# Patient Record
Sex: Female | Born: 1940
Health system: Southern US, Community
[De-identification: ages and names within clinical notes are randomized; demographics above are authoritative.]

---

## 2007-09-28 ENCOUNTER — Ambulatory Visit: Payer: Self-pay | Admitting: Internal Medicine

## 2007-12-06 ENCOUNTER — Ambulatory Visit: Payer: Self-pay | Admitting: Internal Medicine

## 2007-12-14 ENCOUNTER — Ambulatory Visit: Payer: Self-pay | Admitting: Internal Medicine

## 2007-12-19 ENCOUNTER — Ambulatory Visit: Payer: Self-pay | Admitting: Internal Medicine

## 2007-12-20 ENCOUNTER — Ambulatory Visit: Payer: Self-pay | Admitting: Internal Medicine

## 2007-12-22 ENCOUNTER — Ambulatory Visit: Payer: Self-pay | Admitting: Internal Medicine

## 2007-12-23 ENCOUNTER — Ambulatory Visit: Payer: Self-pay | Admitting: Internal Medicine

## 2008-01-22 ENCOUNTER — Ambulatory Visit: Payer: Self-pay | Admitting: Internal Medicine

## 2008-01-24 ENCOUNTER — Ambulatory Visit: Payer: Self-pay | Admitting: Gastroenterology

## 2008-10-03 ENCOUNTER — Ambulatory Visit: Payer: Self-pay | Admitting: Gastroenterology

## 2008-10-21 ENCOUNTER — Ambulatory Visit: Payer: Self-pay | Admitting: Internal Medicine

## 2008-11-20 ENCOUNTER — Ambulatory Visit: Payer: Self-pay | Admitting: Internal Medicine

## 2009-05-21 ENCOUNTER — Ambulatory Visit: Payer: Self-pay | Admitting: Internal Medicine

## 2009-05-31 ENCOUNTER — Ambulatory Visit: Payer: Self-pay | Admitting: Internal Medicine

## 2009-06-04 ENCOUNTER — Ambulatory Visit: Payer: Self-pay | Admitting: Unknown Physician Specialty

## 2009-06-11 ENCOUNTER — Ambulatory Visit: Payer: Self-pay | Admitting: Unknown Physician Specialty

## 2009-06-17 ENCOUNTER — Ambulatory Visit: Payer: Self-pay | Admitting: Internal Medicine

## 2009-06-21 ENCOUNTER — Ambulatory Visit: Payer: Self-pay | Admitting: Internal Medicine

## 2009-07-21 ENCOUNTER — Ambulatory Visit: Payer: Self-pay | Admitting: Internal Medicine

## 2009-08-21 ENCOUNTER — Ambulatory Visit: Payer: Self-pay | Admitting: Internal Medicine

## 2009-09-20 ENCOUNTER — Ambulatory Visit: Payer: Self-pay | Admitting: Internal Medicine

## 2009-10-21 ENCOUNTER — Ambulatory Visit: Payer: Self-pay | Admitting: Internal Medicine

## 2010-02-05 ENCOUNTER — Ambulatory Visit: Payer: Self-pay | Admitting: Internal Medicine

## 2010-02-20 ENCOUNTER — Ambulatory Visit: Payer: Self-pay | Admitting: Internal Medicine

## 2010-07-09 ENCOUNTER — Ambulatory Visit: Payer: Self-pay | Admitting: Internal Medicine

## 2010-07-22 ENCOUNTER — Ambulatory Visit: Payer: Self-pay | Admitting: Internal Medicine

## 2010-10-07 ENCOUNTER — Ambulatory Visit: Payer: Self-pay | Admitting: Internal Medicine

## 2011-01-07 ENCOUNTER — Ambulatory Visit: Payer: Self-pay | Admitting: Internal Medicine

## 2011-01-22 ENCOUNTER — Ambulatory Visit: Payer: Self-pay | Admitting: Internal Medicine

## 2011-05-03 ENCOUNTER — Emergency Department: Payer: Self-pay | Admitting: Emergency Medicine

## 2011-05-03 LAB — URINALYSIS, COMPLETE
Bacteria: NONE SEEN
Bilirubin,UR: NEGATIVE
Glucose,UR: NEGATIVE mg/dL (ref 0–75)
Ketone: NEGATIVE
Nitrite: NEGATIVE
Specific Gravity: 1.003 (ref 1.003–1.030)
Squamous Epithelial: <1
WBC UR: 1 /HPF (ref 0–5)

## 2011-05-03 LAB — DRUG SCREEN, URINE
Barbiturates, Ur Screen: NEGATIVE (ref ?–200)
Cannabinoid 50 Ng, Ur ~~LOC~~: NEGATIVE (ref ?–50)
Cocaine Metabolite,Ur ~~LOC~~: NEGATIVE (ref ?–300)
MDMA (Ecstasy)Ur Screen: NEGATIVE (ref ?–500)
Methadone, Ur Screen: NEGATIVE (ref ?–300)
Opiate, Ur Screen: NEGATIVE (ref ?–300)

## 2011-05-04 LAB — COMPREHENSIVE METABOLIC PANEL
Albumin: 3.4 g/dL (ref 3.4–5.0)
Alkaline Phosphatase: 129 U/L (ref 50–136)
BUN: 10 mg/dL (ref 7–18)
Bilirubin,Total: 0.4 mg/dL (ref 0.2–1.0)
Calcium, Total: 8.9 mg/dL (ref 8.5–10.1)
Chloride: 104 mmol/L (ref 98–107)
Creatinine: 0.95 mg/dL (ref 0.60–1.30)
EGFR (African American): >60
Glucose: 78 mg/dL (ref 65–99)
Potassium: 3.3 mmol/L — ABNORMAL LOW (ref 3.5–5.1)
SGPT (ALT): 47 U/L
Total Protein: 7.1 g/dL (ref 6.4–8.2)

## 2011-05-04 LAB — ETHANOL: Ethanol %: 0.293 % — ABNORMAL HIGH (ref 0.000–0.080)

## 2011-05-04 LAB — CBC
HCT: 36.9 % (ref 35.0–47.0)
HGB: 12.7 g/dL (ref 12.0–16.0)
MCH: 37.7 pg — ABNORMAL HIGH (ref 26.0–34.0)
MCHC: 34.4 g/dL (ref 32.0–36.0)
MCV: 109 fL — ABNORMAL HIGH (ref 80–100)
RDW: 11.8 % (ref 11.5–14.5)
WBC: 6.7 10*3/uL (ref 3.6–11.0)

## 2011-07-11 ENCOUNTER — Emergency Department: Payer: Self-pay | Admitting: Emergency Medicine

## 2011-08-17 ENCOUNTER — Emergency Department: Payer: Self-pay | Admitting: Emergency Medicine

## 2011-09-04 ENCOUNTER — Emergency Department: Payer: Self-pay | Admitting: Emergency Medicine

## 2011-09-04 LAB — COMPREHENSIVE METABOLIC PANEL
Albumin: 2.9 g/dL — ABNORMAL LOW (ref 3.4–5.0)
BUN: 12 mg/dL (ref 7–18)
Bilirubin,Total: 0.5 mg/dL (ref 0.2–1.0)
Calcium, Total: 8.4 mg/dL — ABNORMAL LOW (ref 8.5–10.1)
Chloride: 105 mmol/L (ref 98–107)
Co2: 21 mmol/L (ref 21–32)
EGFR (Non-African Amer.): >60
SGOT(AST): 132 U/L — ABNORMAL HIGH (ref 15–37)
SGPT (ALT): 67 U/L

## 2011-09-04 LAB — URINALYSIS, COMPLETE
Bilirubin,UR: NEGATIVE
Blood: NEGATIVE
Ph: 6 (ref 4.5–8.0)
Protein: NEGATIVE
Specific Gravity: 1.003 (ref 1.003–1.030)
WBC UR: 16 /HPF (ref 0–5)

## 2011-09-04 LAB — CBC
HGB: 10.7 g/dL — ABNORMAL LOW (ref 12.0–16.0)
MCH: 38.4 pg — ABNORMAL HIGH (ref 26.0–34.0)
MCHC: 33.8 g/dL (ref 32.0–36.0)
MCV: 114 fL — ABNORMAL HIGH (ref 80–100)
Platelet: 154 10*3/uL (ref 150–440)
RBC: 2.78 10*6/uL — ABNORMAL LOW (ref 3.80–5.20)
RDW: 12.6 % (ref 11.5–14.5)
WBC: 6.2 10*3/uL (ref 3.6–11.0)

## 2011-09-04 LAB — ETHANOL
Ethanol %: 0.275 % — ABNORMAL HIGH (ref 0.000–0.080)
Ethanol: 275 mg/dL

## 2011-09-08 ENCOUNTER — Emergency Department: Payer: Self-pay | Admitting: Emergency Medicine

## 2011-09-08 LAB — ETHANOL: Ethanol %: 0.297 % — ABNORMAL HIGH (ref 0.000–0.080)

## 2011-09-18 ENCOUNTER — Ambulatory Visit: Payer: Self-pay | Admitting: Internal Medicine

## 2011-09-22 ENCOUNTER — Ambulatory Visit: Payer: Self-pay | Admitting: Internal Medicine

## 2011-09-23 ENCOUNTER — Ambulatory Visit: Payer: Self-pay | Admitting: Internal Medicine

## 2011-09-23 LAB — CBC CANCER CENTER
Basophil %: 0.8 %
HCT: 35.4 % (ref 35.0–47.0)
HGB: 11.7 g/dL — ABNORMAL LOW (ref 12.0–16.0)
Lymphocyte %: 11.8 %
MCV: 113 fL — ABNORMAL HIGH (ref 80–100)
Monocyte #: 0.5 x10 3/mm (ref 0.2–0.9)
Monocyte %: 10.4 %
Neutrophil #: 3.9 x10 3/mm (ref 1.4–6.5)
RDW: 12.7 % (ref 11.5–14.5)
WBC: 5.2 x10 3/mm (ref 3.6–11.0)

## 2011-09-23 LAB — COMPREHENSIVE METABOLIC PANEL
Albumin: 3.5 g/dL (ref 3.4–5.0)
Anion Gap: 5 — ABNORMAL LOW (ref 7–16)
Calcium, Total: 8.8 mg/dL (ref 8.5–10.1)
Co2: 31 mmol/L (ref 21–32)
EGFR (African American): 58 — ABNORMAL LOW
Glucose: 106 mg/dL — ABNORMAL HIGH (ref 65–99)
Osmolality: 274 (ref 275–301)
SGOT(AST): 164 U/L — ABNORMAL HIGH (ref 15–37)
Sodium: 137 mmol/L (ref 136–145)

## 2011-09-23 LAB — APTT: Activated PTT: 29.4 secs (ref 23.6–35.9)

## 2011-09-23 LAB — PROTIME-INR: INR: 0.9

## 2011-10-05 ENCOUNTER — Ambulatory Visit: Payer: Self-pay | Admitting: Internal Medicine

## 2011-10-13 ENCOUNTER — Other Ambulatory Visit (HOSPITAL_COMMUNITY): Payer: Self-pay | Admitting: Cardiothoracic Surgery

## 2011-10-13 DIAGNOSIS — R911 Solitary pulmonary nodule: Secondary | ICD-10-CM

## 2011-10-15 ENCOUNTER — Other Ambulatory Visit: Payer: Self-pay | Admitting: Radiology

## 2011-10-20 ENCOUNTER — Ambulatory Visit (HOSPITAL_COMMUNITY)
Admission: RE | Admit: 2011-10-20 | Discharge: 2011-10-20 | Disposition: A | Discharge status: Home / Self Care | Payer: Medicare Other | Source: Ambulatory Visit | Attending: Cardiothoracic Surgery | Admitting: Cardiothoracic Surgery

## 2011-10-20 ENCOUNTER — Ambulatory Visit (HOSPITAL_COMMUNITY)
Admission: RE | Admit: 2011-10-20 | Discharge: 2011-10-20 | Disposition: A | Discharge status: Home / Self Care | Payer: Medicare Other | Source: Ambulatory Visit | Attending: Interventional Radiology | Admitting: Interventional Radiology

## 2011-10-20 VITALS — BP 130/56 | HR 86 | Temp 98.8°F | Resp 16

## 2011-10-20 DIAGNOSIS — R911 Solitary pulmonary nodule: Secondary | ICD-10-CM

## 2011-10-20 DIAGNOSIS — IMO0002 Reserved for concepts with insufficient information to code with codable children: Secondary | ICD-10-CM | POA: Insufficient documentation

## 2011-10-20 DIAGNOSIS — Z85819 Personal history of malignant neoplasm of unspecified site of lip, oral cavity, and pharynx: Secondary | ICD-10-CM | POA: Insufficient documentation

## 2011-10-20 DIAGNOSIS — Z79899 Other long term (current) drug therapy: Secondary | ICD-10-CM | POA: Insufficient documentation

## 2011-10-20 DIAGNOSIS — I10 Essential (primary) hypertension: Secondary | ICD-10-CM | POA: Insufficient documentation

## 2011-10-20 DIAGNOSIS — C3410 Malignant neoplasm of upper lobe, unspecified bronchus or lung: Secondary | ICD-10-CM | POA: Insufficient documentation

## 2011-10-20 DIAGNOSIS — H409 Unspecified glaucoma: Secondary | ICD-10-CM | POA: Insufficient documentation

## 2011-10-20 DIAGNOSIS — Y849 Medical procedure, unspecified as the cause of abnormal reaction of the patient, or of later complication, without mention of misadventure at the time of the procedure: Secondary | ICD-10-CM | POA: Insufficient documentation

## 2011-10-20 LAB — CBC
Hemoglobin: 11.2 g/dL — ABNORMAL LOW (ref 12.0–15.0)
MCH: 37.7 pg — ABNORMAL HIGH (ref 26.0–34.0)
MCHC: 34.7 g/dL (ref 30.0–36.0)
Platelets: 227 10*3/uL (ref 150–400)
RBC: 2.97 MIL/uL — ABNORMAL LOW (ref 3.87–5.11)

## 2011-10-20 LAB — PROTIME-INR
INR: 0.96 (ref 0.00–1.49)
Prothrombin Time: 13 seconds (ref 11.6–15.2)

## 2011-10-20 LAB — APTT: aPTT: 31 seconds (ref 24–37)

## 2011-10-20 MED ORDER — HYDROCODONE-ACETAMINOPHEN 5-325 MG PO TABS
1.0000 | ORAL_TABLET | ORAL | Status: DC | PRN
  Filled 2011-10-20: qty 2

## 2011-10-20 MED ORDER — FENTANYL CITRATE 0.05 MG/ML IJ SOLN
INTRAMUSCULAR | Status: AC
  Filled 2011-10-20: qty 4

## 2011-10-20 MED ORDER — SODIUM CHLORIDE 0.9 % IV SOLN: Freq: Once | INTRAVENOUS | Status: DC

## 2011-10-20 MED ORDER — MIDAZOLAM HCL 5 MG/5ML IJ SOLN
INTRAMUSCULAR | Status: AC | PRN
  Administered 2011-10-20 (×2): 1 mg via INTRAVENOUS

## 2011-10-20 MED ORDER — MIDAZOLAM HCL 2 MG/2ML IJ SOLN
INTRAMUSCULAR | Status: AC
  Filled 2011-10-20: qty 4

## 2011-10-20 MED ORDER — FENTANYL CITRATE 0.05 MG/ML IJ SOLN
INTRAMUSCULAR | Status: AC | PRN
  Administered 2011-10-20 (×2): 50 ug via INTRAVENOUS

## 2011-10-20 NOTE — H&P (Signed)
Agree 

## 2011-10-20 NOTE — H&P (Signed)
Ashley Frank is an 71 y.o. female.   Chief Complaint: "I'm here for a lung biopsy" HPI: Patient with history of squamous cell carcinoma of head/neck and hypermetabolic lung masses on recent PET scan presents today for CT guided biopsy of a right upper lobe lung mass.  PMH:HTN, glaucoma, throat cancer [id number], appendectomy, hysterectomy Social History: prior smoker, widowed, lives in [place] VH:QIONGEXB for bladder cancer, DM; 1 sister Allergies:  Allergies  Allergen Reactions  . Zantac (Ranitidine Hcl) Rash    Current outpatient prescriptions:buPROPion (WELLBUTRIN XL) 150 MG 24 hr tablet, Take 150 mg by mouth daily., Disp: , Rfl: ;  chlordiazePOXIDE (LIBRIUM) 10 MG capsule, Take 10 mg by mouth at bedtime., Disp: , Rfl: ;  lactose free nutrition (BOOST) LIQD, Take 1 Container by mouth daily., Disp: , Rfl: ;  losartan-hydrochlorothiazide (HYZAAR) 100-12.5 MG per tablet, Take 1 tablet by mouth daily., Disp: , Rfl:  potassium chloride 20 MEQ/15ML (10%) solution, Take 20 mEq by mouth daily., Disp: , Rfl: ;  Risedronate Sodium (ATELVIA) 35 MG TBEC, Take 1 tablet by mouth once a week. On saturday, Disp: , Rfl: ;  senna-docusate (PERI-COLACE) 8.6-50 MG per tablet, Take 2 tablets by mouth daily as needed. For constipation, Disp: , Rfl: ;  timolol (BETIMOL) 0.5 % ophthalmic solution, Place 1 drop into both eyes 2 (two) times daily., Disp: , Rfl:  Current facility-administered medications:0.9 %  sodium chloride infusion, , Intravenous, Once, Brayton El, PA   Results for orders placed during the hospital encounter of 10/20/11 (from the past 48 hour(s))  CBC     Status: Abnormal   Collection Time   10/20/11 11:40 AM      Component Value Range Comment   WBC 7.7  4.0 - 10.5 K/uL    RBC 2.97 (*) 3.87 - 5.11 MIL/uL    Hemoglobin 11.2 (*) 12.0 - 15.0 g/dL    HCT 28.4 (*) 13.2 - 46.0 %    MCV 108.8 (*) 78.0 - 100.0 fL    MCH 37.7 (*) 26.0 - 34.0 pg    MCHC 34.7  30.0 - 36.0 g/dL    RDW 44.0   10.2 - 72.5 %    Platelets 227  150 - 400 K/uL    Results for orders placed during the hospital encounter of 10/20/11  APTT      Component Value Range   aPTT 31  24 - 37 seconds  CBC      Component Value Range   WBC 7.7  4.0 - 10.5 K/uL   RBC 2.97 (*) 3.87 - 5.11 MIL/uL   Hemoglobin 11.2 (*) 12.0 - 15.0 g/dL   HCT 36.6 (*) 44.0 - 34.7 %   MCV 108.8 (*) 78.0 - 100.0 fL   MCH 37.7 (*) 26.0 - 34.0 pg   MCHC 34.7  30.0 - 36.0 g/dL   RDW 42.5  95.6 - 38.7 %   Platelets 227  150 - 400 K/uL  PROTIME-INR      Component Value Range   Prothrombin Time 13.0  11.6 - 15.2 seconds   INR 0.96  0.00 - 1.49    Review of Systems  Constitutional: Negative for fever and chills.  HENT:       Occ HA's  Respiratory: Negative for cough, hemoptysis and shortness of breath.   Cardiovascular: Negative for chest pain.  Gastrointestinal: Negative for nausea, vomiting and abdominal pain.  Musculoskeletal: Negative for back pain.  Endo/Heme/Allergies: Bruises/bleeds easily.    Blood pressure 137/59, pulse 89,  temperature 99.5 F (37.5 C), SpO2 99.00%. Physical Exam  Constitutional: She is oriented to person, place, and time.       Thin WF in NAD  Cardiovascular: Normal rate and regular rhythm.   Respiratory: Effort normal and breath sounds normal.  GI: Soft. Bowel sounds are normal. There is no tenderness.  Musculoskeletal: Normal range of motion. She exhibits no edema.  Neurological: She is alert and oriented to person, place, and time.     Assessment/Plan Patient with history of squamous cell carcinoma of head/neck and hypermetabolic lung masses; plan is for CT guided biopsy of a right upper lobe lung mass today. Details/risks of procedure d/w pt with her understanding and consent.  Jerusalem Wert,D KEVIN 10/20/2011, 12:16 PM

## 2011-10-20 NOTE — Procedures (Signed)
Procedure:  CT guided core biopsy of right lung nodule Findings:  Right apical lung nodule biopsy from posterior approach via 17 G needle.  18 G core biopsy x 2 Regional hemorrhage after biopsy without pneumothorax.

## 2011-10-22 ENCOUNTER — Ambulatory Visit: Payer: Self-pay | Admitting: Internal Medicine

## 2011-11-17 LAB — BASIC METABOLIC PANEL
BUN: 9 mg/dL (ref 7–18)
Calcium, Total: 8.8 mg/dL (ref 8.5–10.1)
Chloride: 106 mmol/L (ref 98–107)
EGFR (African American): 55 — ABNORMAL LOW
Osmolality: 284 (ref 275–301)
Potassium: 3.7 mmol/L (ref 3.5–5.1)
Sodium: 143 mmol/L (ref 136–145)

## 2011-11-17 LAB — CBC CANCER CENTER
Eosinophil #: 0 x10 3/mm (ref 0.0–0.7)
HCT: 34.8 % — ABNORMAL LOW (ref 35.0–47.0)
Lymphocyte %: 11.6 %
MCH: 37.7 pg — ABNORMAL HIGH (ref 26.0–34.0)
MCHC: 33.5 g/dL (ref 32.0–36.0)
Monocyte #: 0.4 x10 3/mm (ref 0.2–0.9)
Neutrophil %: 79.8 %
Platelet: 204 x10 3/mm (ref 150–440)
RBC: 3.09 10*6/uL — ABNORMAL LOW (ref 3.80–5.20)

## 2011-11-17 LAB — HEPATIC FUNCTION PANEL A (ARMC)
Alkaline Phosphatase: 172 U/L — ABNORMAL HIGH (ref 50–136)
SGPT (ALT): 38 U/L (ref 12–78)

## 2011-11-22 ENCOUNTER — Ambulatory Visit: Payer: Self-pay | Admitting: Internal Medicine

## 2011-11-24 LAB — CBC CANCER CENTER
Basophil %: 0.5 %
Eosinophil %: 1 %
HGB: 9.8 g/dL — ABNORMAL LOW (ref 12.0–16.0)
Lymphocyte %: 33.3 %
MCH: 37.4 pg — ABNORMAL HIGH (ref 26.0–34.0)
MCV: 111 fL — ABNORMAL HIGH (ref 80–100)
Monocyte %: 3.7 %
Neutrophil %: 61.5 %
Platelet: 82 x10 3/mm — ABNORMAL LOW (ref 150–440)
RBC: 2.61 10*6/uL — ABNORMAL LOW (ref 3.80–5.20)

## 2011-12-01 LAB — HEPATIC FUNCTION PANEL A (ARMC)
Albumin: 2.8 g/dL — ABNORMAL LOW (ref 3.4–5.0)
Alkaline Phosphatase: 132 U/L (ref 50–136)
Bilirubin,Total: 0.7 mg/dL (ref 0.2–1.0)
SGPT (ALT): 48 U/L (ref 12–78)
Total Protein: 6 g/dL — ABNORMAL LOW (ref 6.4–8.2)

## 2011-12-01 LAB — CBC CANCER CENTER
HCT: 25 % — ABNORMAL LOW (ref 35.0–47.0)
HGB: 8.8 g/dL — ABNORMAL LOW (ref 12.0–16.0)
Lymphocytes: 21 %
MCHC: 35.2 g/dL (ref 32.0–36.0)
RBC: 2.29 10*6/uL — ABNORMAL LOW (ref 3.80–5.20)
RDW: 11.5 % (ref 11.5–14.5)
Segmented Neutrophils: 53 %
WBC: 1.3 x10 3/mm — CL (ref 3.6–11.0)

## 2011-12-01 LAB — BASIC METABOLIC PANEL
Anion Gap: 10 (ref 7–16)
BUN: 18 mg/dL (ref 7–18)
Co2: 22 mmol/L (ref 21–32)
Creatinine: 1.19 mg/dL (ref 0.60–1.30)
EGFR (African American): 54 — ABNORMAL LOW
EGFR (Non-African Amer.): 46 — ABNORMAL LOW
Osmolality: 272 (ref 275–301)

## 2011-12-08 LAB — CBC CANCER CENTER
Basophil #: 0 x10 3/mm (ref 0.0–0.1)
Basophil %: 0.1 %
Eosinophil #: 0 x10 3/mm (ref 0.0–0.7)
Eosinophil %: 0.6 %
HGB: 9.2 g/dL — ABNORMAL LOW (ref 12.0–16.0)
Lymphocyte %: 9.1 %
MCH: 39.3 pg — ABNORMAL HIGH (ref 26.0–34.0)
MCHC: 35.4 g/dL (ref 32.0–36.0)
MCV: 111 fL — ABNORMAL HIGH (ref 80–100)
Monocyte #: 0.5 x10 3/mm (ref 0.2–0.9)
Monocyte %: 15.2 %
Neutrophil #: 2.7 x10 3/mm (ref 1.4–6.5)
Neutrophil %: 75 %
RBC: 2.34 10*6/uL — ABNORMAL LOW (ref 3.80–5.20)

## 2011-12-15 LAB — CBC CANCER CENTER
Eosinophil #: 0 x10 3/mm (ref 0.0–0.7)
HCT: 27.4 % — ABNORMAL LOW (ref 35.0–47.0)
Lymphocyte #: 0.3 x10 3/mm — ABNORMAL LOW (ref 1.0–3.6)
Lymphocyte %: 6.6 %
MCHC: 33.8 g/dL (ref 32.0–36.0)
MCV: 115 fL — ABNORMAL HIGH (ref 80–100)
Monocyte %: 11.2 %
Neutrophil %: 81.3 %
Platelet: 178 x10 3/mm (ref 150–440)
RDW: 14.1 % (ref 11.5–14.5)
WBC: 4.3 x10 3/mm (ref 3.6–11.0)

## 2011-12-22 ENCOUNTER — Ambulatory Visit: Payer: Self-pay | Admitting: Internal Medicine

## 2011-12-22 LAB — CBC CANCER CENTER
Basophil #: 0 x10 3/mm (ref 0.0–0.1)
Eosinophil #: 0 x10 3/mm (ref 0.0–0.7)
HGB: 7.4 g/dL — ABNORMAL LOW (ref 12.0–16.0)
Lymphocyte %: 9.2 %
MCH: 37.9 pg — ABNORMAL HIGH (ref 26.0–34.0)
MCHC: 33.3 g/dL (ref 32.0–36.0)
MCV: 114 fL — ABNORMAL HIGH (ref 80–100)
Monocyte #: 0.1 x10 3/mm — ABNORMAL LOW (ref 0.2–0.9)
Neutrophil %: 80.8 %
Platelet: 63 x10 3/mm — ABNORMAL LOW (ref 150–440)
RDW: 14.2 % (ref 11.5–14.5)
WBC: 0.8 x10 3/mm — CL (ref 3.6–11.0)

## 2011-12-29 LAB — HEPATIC FUNCTION PANEL A (ARMC)
Albumin: 2.8 g/dL — ABNORMAL LOW (ref 3.4–5.0)
Alkaline Phosphatase: 124 U/L (ref 50–136)
Bilirubin, Direct: 0.2 mg/dL (ref 0.00–0.20)
Bilirubin,Total: 0.5 mg/dL (ref 0.2–1.0)
SGPT (ALT): 29 U/L (ref 12–78)
Total Protein: 6.2 g/dL — ABNORMAL LOW (ref 6.4–8.2)

## 2011-12-29 LAB — CBC CANCER CENTER
Basophil #: 0 x10 3/mm (ref 0.0–0.1)
Eosinophil #: 0 x10 3/mm (ref 0.0–0.7)
Lymphocyte #: 0.3 x10 3/mm — ABNORMAL LOW (ref 1.0–3.6)
Lymphocyte %: 9.1 %
MCHC: 33.3 g/dL (ref 32.0–36.0)
Monocyte #: 1 x10 3/mm — ABNORMAL HIGH (ref 0.2–0.9)
Neutrophil #: 1.6 x10 3/mm (ref 1.4–6.5)
Neutrophil %: 55 %
Platelet: 54 x10 3/mm — ABNORMAL LOW (ref 150–440)
RBC: 1.69 10*6/uL — ABNORMAL LOW (ref 3.80–5.20)
RDW: 14.8 % — ABNORMAL HIGH (ref 11.5–14.5)

## 2011-12-29 LAB — BASIC METABOLIC PANEL
Anion Gap: 12 (ref 7–16)
BUN: 22 mg/dL — ABNORMAL HIGH (ref 7–18)
Calcium, Total: 8.8 mg/dL (ref 8.5–10.1)
EGFR (African American): 36 — ABNORMAL LOW
EGFR (Non-African Amer.): 31 — ABNORMAL LOW
Glucose: 108 mg/dL — ABNORMAL HIGH (ref 65–99)
Osmolality: 280 (ref 275–301)
Potassium: 3 mmol/L — ABNORMAL LOW (ref 3.5–5.1)
Sodium: 138 mmol/L (ref 136–145)

## 2011-12-30 ENCOUNTER — Ambulatory Visit: Payer: Self-pay | Admitting: Internal Medicine

## 2012-01-05 LAB — CBC CANCER CENTER
Basophil %: 0.1 %
Eosinophil %: 2.5 %
HCT: 24.5 % — ABNORMAL LOW (ref 35.0–47.0)
HGB: 8.2 g/dL — ABNORMAL LOW (ref 12.0–16.0)
Lymphocyte #: 0.5 x10 3/mm — ABNORMAL LOW (ref 1.0–3.6)
Lymphocyte %: 10.1 %
MCV: 111 fL — ABNORMAL HIGH (ref 80–100)
Monocyte %: 19.8 %
Neutrophil #: 3.2 x10 3/mm (ref 1.4–6.5)
Platelet: 202 x10 3/mm (ref 150–440)
RBC: 2.21 10*6/uL — ABNORMAL LOW (ref 3.80–5.20)
WBC: 4.7 x10 3/mm (ref 3.6–11.0)

## 2012-01-15 ENCOUNTER — Encounter: Payer: Self-pay | Admitting: Internal Medicine

## 2012-01-22 ENCOUNTER — Ambulatory Visit: Payer: Self-pay | Admitting: Internal Medicine

## 2012-01-26 LAB — CBC CANCER CENTER
Basophil #: 0 x10 3/mm (ref 0.0–0.1)
HCT: 23.3 % — ABNORMAL LOW (ref 35.0–47.0)
Lymphocyte %: 10 %
MCH: 37.6 pg — ABNORMAL HIGH (ref 26.0–34.0)
MCHC: 32.7 g/dL (ref 32.0–36.0)
Monocyte %: 17.4 %
Neutrophil #: 4.7 x10 3/mm (ref 1.4–6.5)
Neutrophil %: 69.4 %
RBC: 2.02 10*6/uL — ABNORMAL LOW (ref 3.80–5.20)
RDW: 15.2 % — ABNORMAL HIGH (ref 11.5–14.5)
WBC: 6.7 x10 3/mm (ref 3.6–11.0)

## 2012-02-02 LAB — CBC CANCER CENTER
Basophil #: 0 x10 3/mm (ref 0.0–0.1)
Eosinophil #: 0.3 x10 3/mm (ref 0.0–0.7)
HGB: 7.7 g/dL — ABNORMAL LOW (ref 12.0–16.0)
Lymphocyte %: 7.1 %
MCHC: 32.9 g/dL (ref 32.0–36.0)
Monocyte %: 14.8 %
Neutrophil #: 5.1 x10 3/mm (ref 1.4–6.5)
Neutrophil %: 72.8 %
Platelet: 245 x10 3/mm (ref 150–440)
RBC: 2.03 10*6/uL — ABNORMAL LOW (ref 3.80–5.20)
RDW: 13.7 % (ref 11.5–14.5)

## 2012-02-16 LAB — CBC CANCER CENTER
Basophil #: 0 x10 3/mm (ref 0.0–0.1)
Eosinophil #: 0.3 x10 3/mm (ref 0.0–0.7)
Lymphocyte #: 0.6 x10 3/mm — ABNORMAL LOW (ref 1.0–3.6)
Lymphocyte %: 10.8 %
MCHC: 35 g/dL (ref 32.0–36.0)
MCV: 111 fL — ABNORMAL HIGH (ref 80–100)
Monocyte %: 14.6 %
Neutrophil #: 4.1 x10 3/mm (ref 1.4–6.5)
Neutrophil %: 69.3 %
Platelet: 243 x10 3/mm (ref 150–440)
RDW: 12.5 % (ref 11.5–14.5)
WBC: 6 x10 3/mm (ref 3.6–11.0)

## 2012-02-16 LAB — HEPATIC FUNCTION PANEL A (ARMC)
Albumin: 2.8 g/dL — ABNORMAL LOW (ref 3.4–5.0)
Alkaline Phosphatase: 138 U/L — ABNORMAL HIGH (ref 50–136)
Bilirubin, Direct: 0.1 mg/dL (ref 0.00–0.20)
SGOT(AST): 23 U/L (ref 15–37)
SGPT (ALT): 11 U/L — ABNORMAL LOW (ref 12–78)
Total Protein: 6.3 g/dL — ABNORMAL LOW (ref 6.4–8.2)

## 2012-02-21 ENCOUNTER — Ambulatory Visit: Payer: Self-pay | Admitting: Internal Medicine

## 2012-03-08 ENCOUNTER — Encounter: Payer: Self-pay | Admitting: Cardiothoracic Surgery

## 2012-03-08 ENCOUNTER — Encounter: Payer: Self-pay | Admitting: Nurse Practitioner

## 2012-03-23 ENCOUNTER — Ambulatory Visit: Payer: Self-pay | Admitting: Internal Medicine

## 2012-03-23 ENCOUNTER — Encounter: Payer: Self-pay | Admitting: Nurse Practitioner

## 2012-03-23 ENCOUNTER — Encounter: Payer: Self-pay | Admitting: Cardiothoracic Surgery

## 2012-04-23 DEATH — deceased

## 2013-05-16 IMAGING — CT CT CERVICAL SPINE WITHOUT CONTRAST
1 series · 12 of 14 positions shown, 15 images · non-contrast
Comparison: None

REASON FOR EXAM: fall with occipital trauma
COMMENTS:

PROCEDURE:     CT  - CT CERVICAL SPINE WO  - September 08, 2011 [DATE]
RESULT:     Clinical Indication: Trauma
TECHNIQUE: Multiple axial CT images from the skull base to the mid vertebral
body of T1. obtained with sagittal and coronal reformatted images provided.

[Series 4: axial · axial · 0.33mm/px · z∈[+156,+271]mm · 12 of 83 slices shown, 15 images]
[im 7/83  soft-tissue]
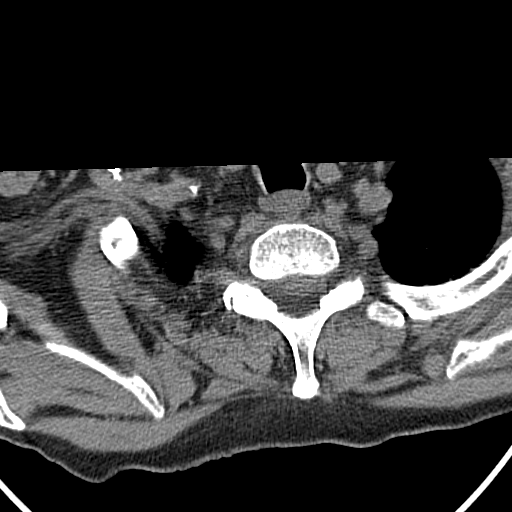
[im 7/83  bone]
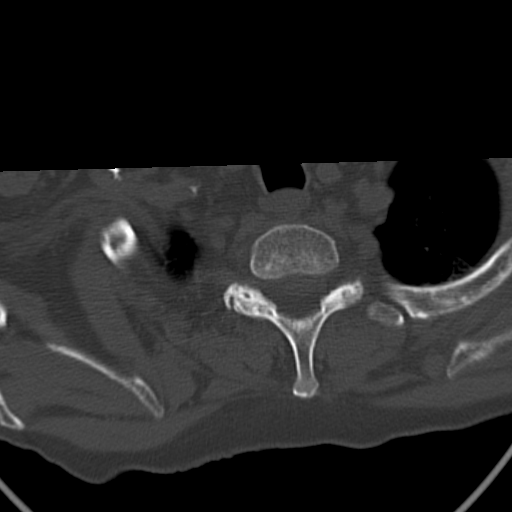
[im 13/83  bone]
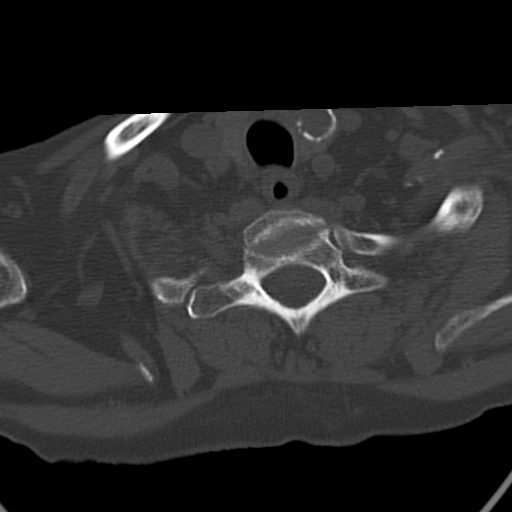
[im 19/83  bone]
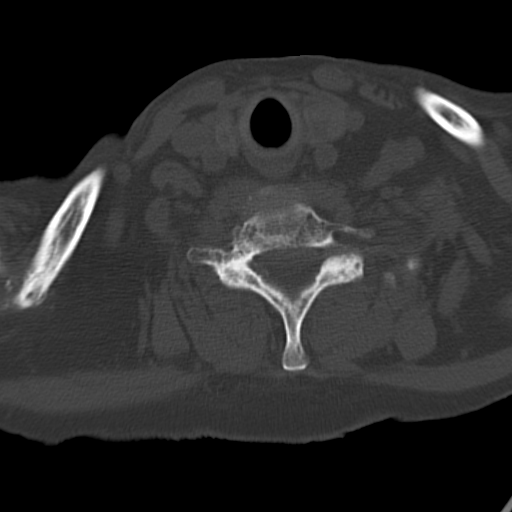
[im 26/83  bone]
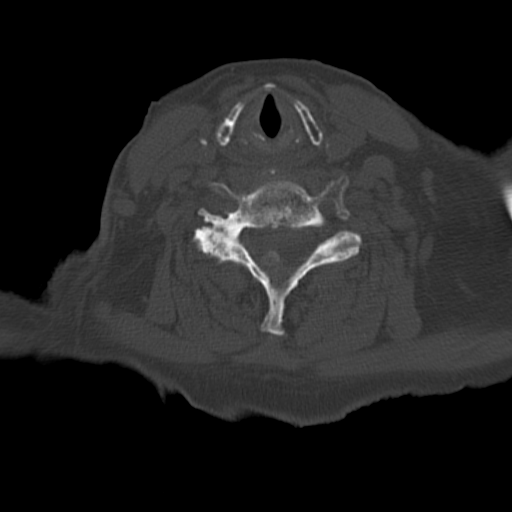
[im 32/83  soft-tissue]
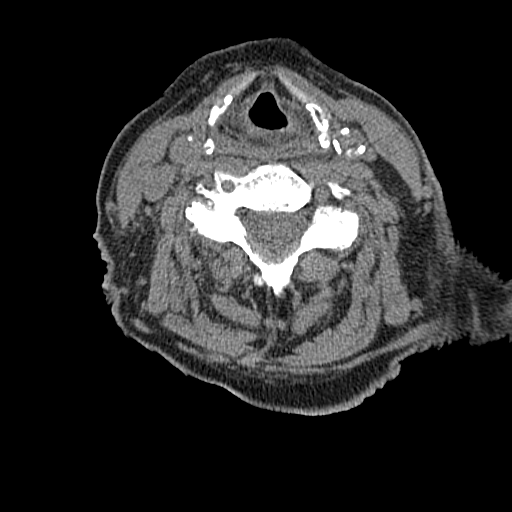
[im 32/83  bone]
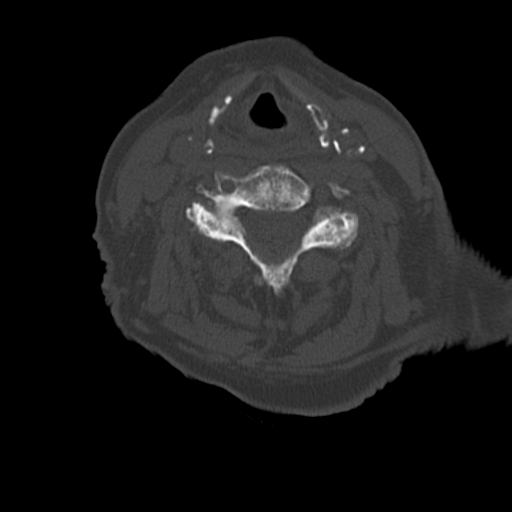
[im 38/83  bone]
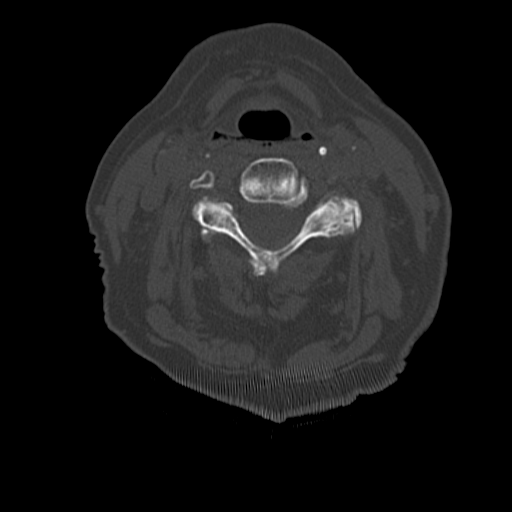
[im 45/83  bone]
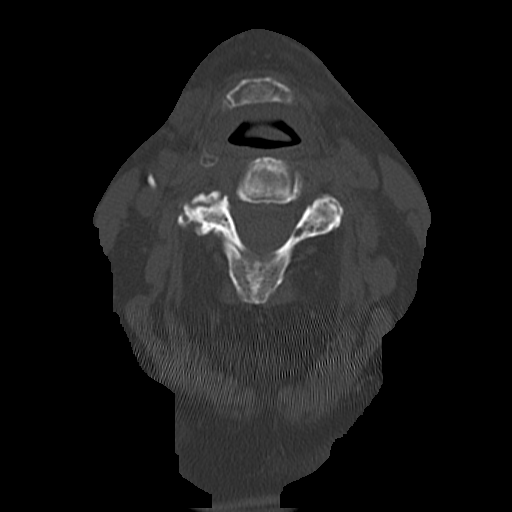
[im 51/83  bone]
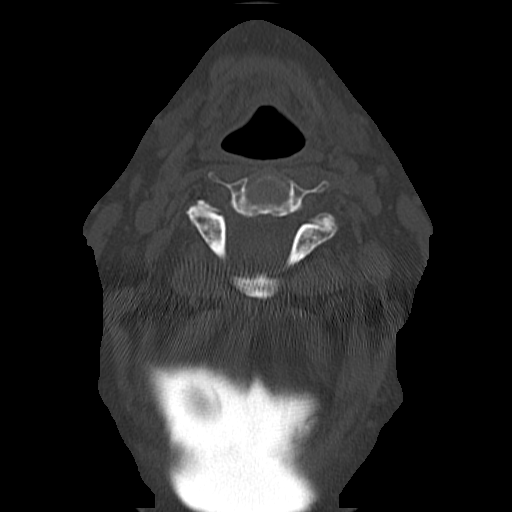
[im 57/83  soft-tissue]
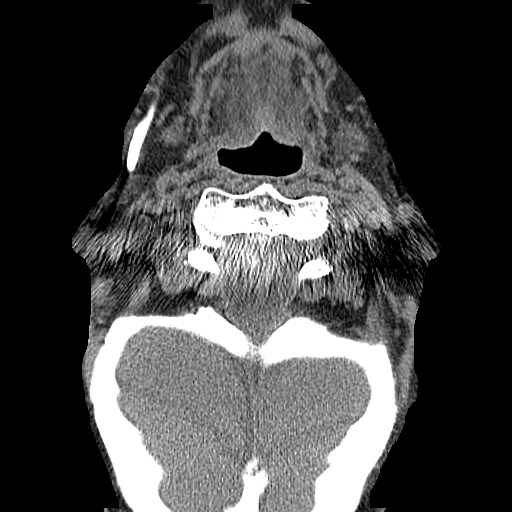
[im 57/83  bone]
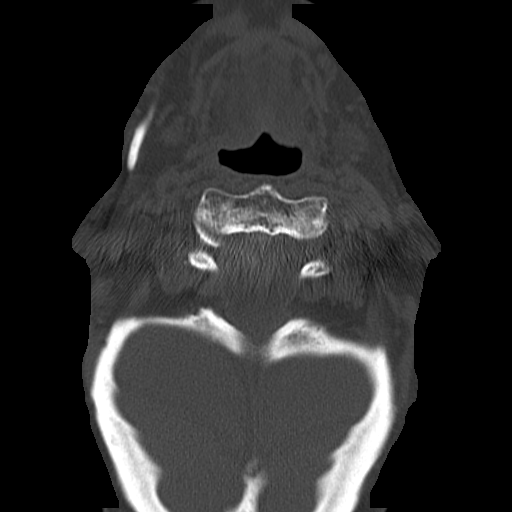
[im 64/83  bone]
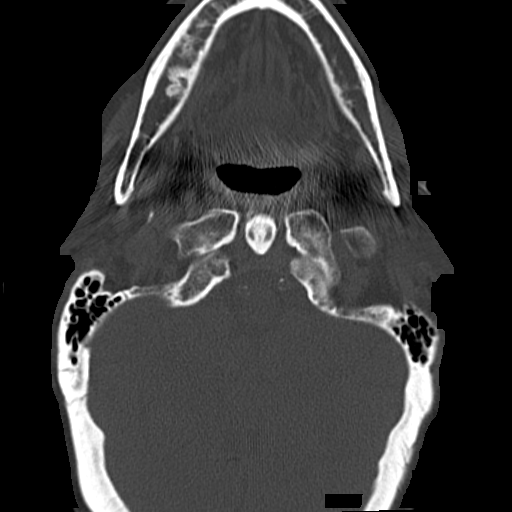
[im 70/83  bone]
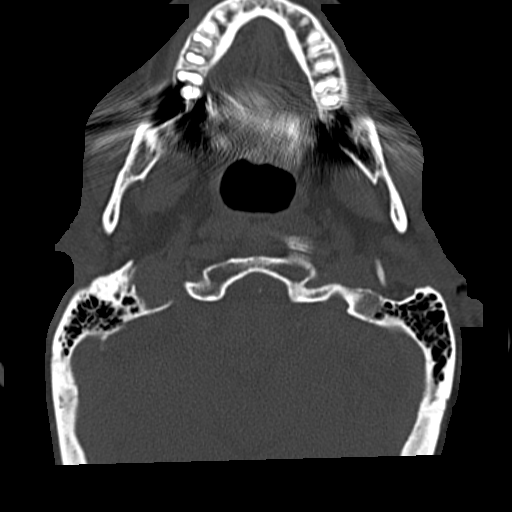
[im 76/83  bone]
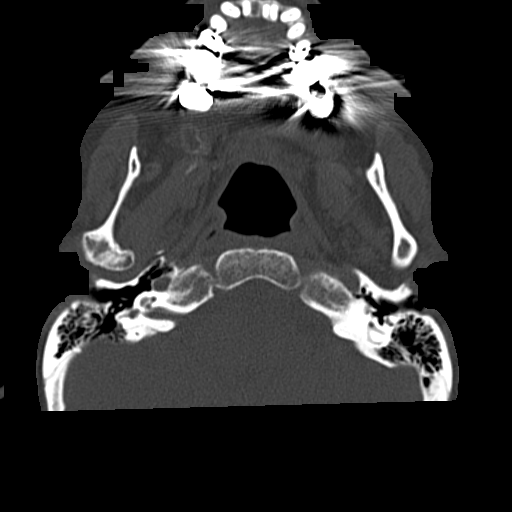

[12 of 14 positions shown; findings below may reference images not displayed]

FINDINGS: The alignment is anatomic. The vertebral body heights are maintained. There
is no acute fracture or static listhesis. The prevertebral soft tissues are
normal. The intraspinal soft tissues are not fully imaged on this
examination due to poor soft tissue contrast, but there is no soft tissue
gross abnormality.

There is facet arthropathy throughout the cervical spine. The disc spaces
are maintained.

The visualized portions of the lung apices demonstrate no focal abnormality.
IMPRESSION: 1. No acute osseous injury of the cervical spine.

2. Ligamentous injury is not evaluated. If there is high clinical concern
for ligamentous injury, consider MRI or flexion/extension radiographs as
clinically indicated and tolerated.

[REDACTED]

## 2013-06-27 IMAGING — CT CT BIOPSY
1 of 12 series · 3 of 32 positions shown, 7 images · non-contrast
Comparison: PET scan obtained at [HOSPITAL]
on 10/05/2011.

CLINICAL DATA: History of supraglottic squamous cell carcinoma.
The patient has developed bilateral upper lobe pulmonary nodules,
right greater than left, with PET scan demonstrating increased
metabolic activity.

CT GUIDED CORE BIOPSY OF RIGHT UPPER LOBE LUNG NODULE
Sedation:   2.0 mg IV Versed; 100 mcg IV Fentanyl
Total Moderate Sedation Time: 22 minutes.

[Series 5: add scan 5.0 b70f · axial · 0.32mm/px · z∈[-201,-176]mm · 3 of 6 slices shown, 7 images]
[im 1/6  soft-tissue]
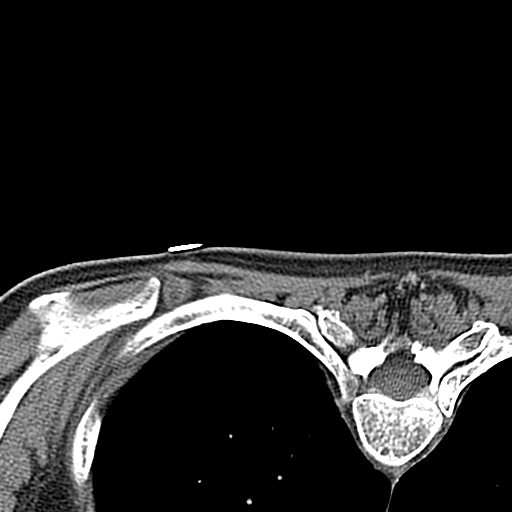
[im 1/6  lung]
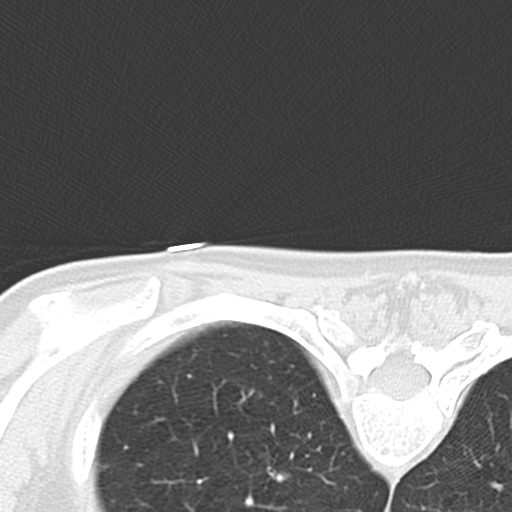
[im 1/6  bone]
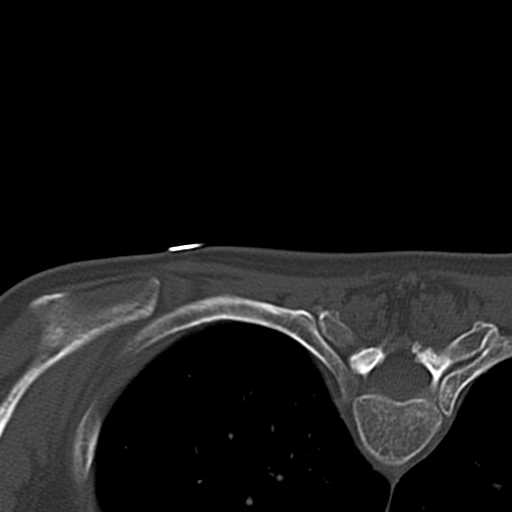
[im 3/6  soft-tissue]
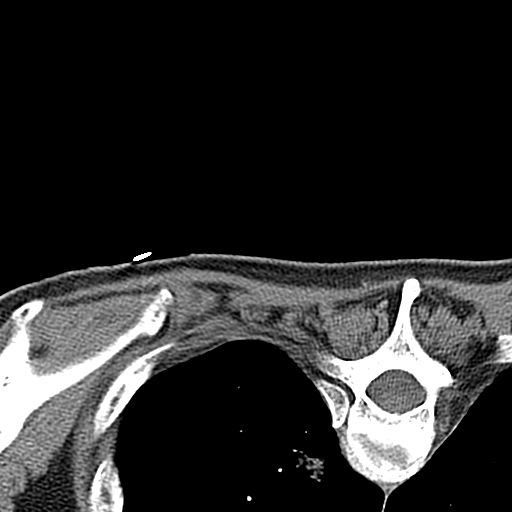
[im 3/6  lung]
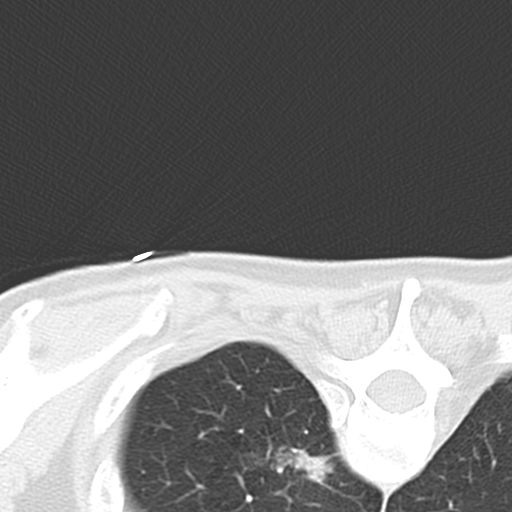
[im 6/6  soft-tissue]
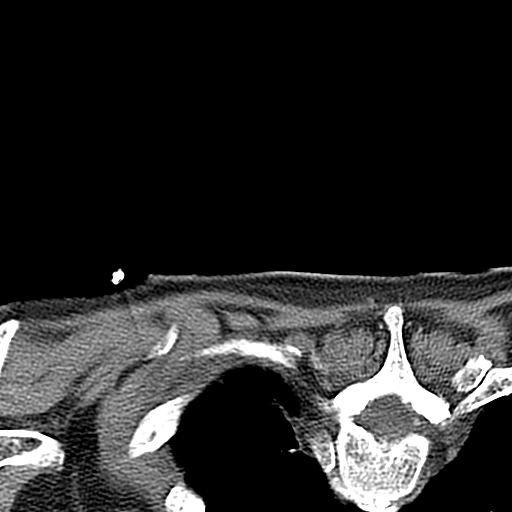
[im 6/6  lung]
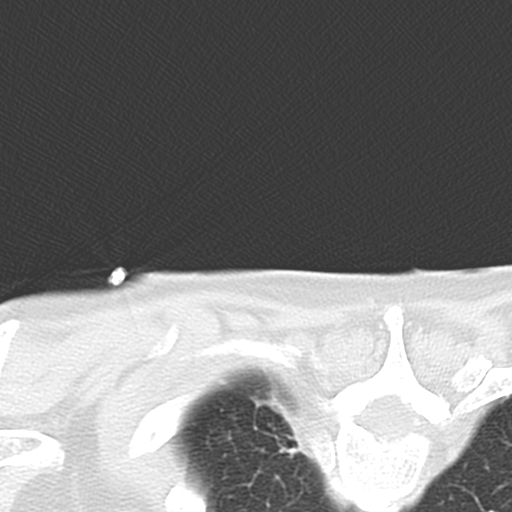

[3 of 32 positions shown; findings below may reference images not displayed]

Procedure:  The procedure risks, benefits, and alternatives were
explained to the patient.  Questions regarding the procedure were
encouraged and answered.  The patient understands and consents to
the procedure.

The posterior upper chest was prepped with Betadine in a sterile
fashion, and a sterile drape was applied covering the operative
field.  A sterile gown and sterile gloves were used for the
procedure.  Local anesthesia was provided with 1% Lidocaine.

Initial CT through the upper lung zones was performed in a prone
position.  Biopsy of the right upper lobe nodule was chosen from a
paraspinal approach posteriorly.  A 17 gauge trocar needle was
advanced under CT guidance to the margin of the nodule.  Coaxial 18-
gauge core biopsy samples were obtained.  Two samples were
submitted in formalin.  Additional CT images were performed.

Complications: Pulmonary hemorrhage around the nodule.
FINDINGS: Limited CT demonstrates a roughly 0.8 x 1.4 cm solid
nodule in the medial apex of the right upper lobe and a
predominately cavitary a 1.1 cm nodule in the posterior left upper
lobe.  There is an additional subtle small area of cavitation in
the posterior left upper lobe measuring 5 mm, a central solid
nodule in the left upper lobe measuring 6 mm in diameter and a
spiculated area of nodularity more inferiorly in the right upper
lobe measuring approximately 7 mm.  These were not definitively
characterized on the prior PET scan and are highly suspicious for
metastatic disease. The entire chest was not scanned at the time of
the biopsy procedure to fully evaluate for other metastatic
lesions.

Biopsy of the right upper lobe nodule yielded solid tissue. Biopsy
was complicated by regional hemorrhage around the nodule which did
not result in immediate hemoptysis.  There was no evidence of
pneumothorax.  The patient will be observed on bedrest for 3 hours
after the procedure.
IMPRESSION: CT guided core biopsy performed of the right upper lobe pulmonary
nodule.  As above, preliminary scans through the upper lung zones
demonstrate at least three other areas of nodularity suspicious for
metastatic disease in the upper lobes bilaterally which were not
depicted by prior PET scan.

## 2014-07-10 NOTE — Consult Note (Signed)
Reason for Visit: This 74 year old Female patient presents to the clinic for initial evaluation of  Lung cancer .   Referred by Dr. Ma Hillock.  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 She is done well having very little side effects from his treatments and is in complete remission. She was noted to have on chest x-ray a nodule in the right upper lobe underwent PET/CT scan showing bilateral avid nodules in the upper superior sulcus bilaterally. PET scan also demonstrated left-sided hilar and mediastinal adenopathy. She was seen [facility] and underwent a needle biopsy of the 15 mm right upper lobe nodule which was positive for well-differentiated adenocarcinoma. The PET positive nodes are in the AP window extending into the left hilar region. She has been seen by surgical oncology and is now referred to radiation oncology for consideration of treatment. She is not having any pulmonary symptoms at this time. No cough hemoptysis or chest tightness.  Past Hx:    etoh daily use:    Anxiety:    Depression:    Glaucoma:    Cataracts:    Macular Degeneration:    Wheezing:    Swallowing difficulty:    Laryngeal Cancer:    chronic smoker:    cataracts and glaucoma:    COPD:    depression and anxiety:    HTN:    Hysterectomy - Partial:    Tonsillectomy:    Appendectomy:    tonsillectomy:    partial hysterectomy:    appendectomy:    Past, Family and Social History:   Past Medical History positive    Cardiovascular hyperlipidemia; hypertension    Respiratory COPD; Wheezing    Gastrointestinal Slight dysphasia, history of duodenal ulcer and chronic duodenitis    Neurological/Psychiatric anxiety; depression    Past Surgical History appendectomy; Hysterectomy, tonsillectomy    Past Medical History Comments Glaucoma, cataracts, macular degeneration    Family History positive    Family History Comments Family history positive for colon cancer and lung cancer and adult onset diabetes    Social History positive    Social History Comments Significant EtOH use history on a daily basis. Greater than 50-pack-year smoking history    Additional Past Medical and Surgical History Seen by herself today   Allergies:   Ranitidine: Rash  Home Meds:  Home Medications: Medication Instructions Status  Stress Formula with Zinc oral tablet 1 tab(s) orally once a day  Active  losartan 100 mg oral tablet 1 tab(s) orally once a day  Active  Zyrtec 10 mg oral tablet 1 tab(s) orally once a day  Active  Peri-Colace 50 mg-8.6 mg oral tablet 2 cap(s) orally once a day  Active  Atelvia 35 mg oral enteric coated tablet 1 tab(s) orally once a week  Active  potassium chloride 20 mEq/15 mL oral liquid 10 milliliter(s) orally once a day Active  hydrochlorothiazide 12.5 mg oral capsule 1 cap(s) orally once a day Active  Wellbutrin SR  150 mg/12 hours oral tablet, extended release 1 tab(s) orally once a day Active  chlordiazepoxide 10 mg oral capsule 1 cap(s) orally once a day AM Active  timolol ophthalmic 0.5% solution 1 gtt drops to each eye 2 times a day  Active   Review of Systems:   General negative    Performance Status (ECOG) 0    Skin negative    Breast negative    Ophthalmologic negative    ENMT see HPI    Respiratory and Thorax see HPI    Cardiovascular see HPI    Gastrointestinal see HPI    Genitourinary  negative    Musculoskeletal negative    Neurological negative    Psychiatric see HPI    Hematology/Lymphatics negative    Endocrine negative    Allergic/Immunologic negative   Nursing Notes:  Nursing Vital Signs and Chemo Nursing Nursing Notes: *CC Vital Signs Flowsheet:   08-Aug-13 13:42   Pulse Pulse 102   Respirations Respirations 18   SBP SBP 161   DBP DBP 86   Pain Scale (0-10)  0   Current Weight (kg) (kg) 45.6   Height (cm) centimeters 152.4   BSA (m2) 1.3   Physical Exam:  General/Skin/HEENT:   General normal    Skin normal    Eyes normal    ENMT normal    Head and Neck normal    Additional PE Well-developed female wheelchair-bound in NAD. Oral cavity is clear. Neck is clear without evidence of cervical supraclavicular or digastric adenopathy. Lungs are clear to A&P cardiac examination shows regular rate and rhythm. Abdomen is benign with no organomegaly or masses noted. She does have some lymphedema of her anterior neck consistent with prior radiation therapy treatments.   Breasts/Resp/CV/GI/GU:   Respiratory and Thorax normal    Cardiovascular normal    Gastrointestinal normal    Genitourinary normal   MS/Neuro/Psych/Lymph:   Musculoskeletal normal    Neurological normal    Lymphatics normal   Assessment and Plan:  Impression:   stage IIIB adenocarcinoma of the lung in 74 year old female with prior history of head and neck cancer.  Plan:   we have discussed her case her weekly tumor conference. Surgical oncology is declined surgery based on her IIIB status and overall Gen. condition. Would plan on delivering radiation therapy in a split course fashion. We will plan on delivering 4000 cGy over 4 weeks and evaluate for response. We will also evaluate the patient tolerance. I have discussed the case personally with Dr. Ma Hillock who may provide radiation sensitizing chemotherapy. Risks and benefits of treatment including increasing dysphasia, skin  reaction, alteration blood counts and possible deterioration of her pulmonary functions were all discussed in detail with the patient. I have set her up for CT simulation early next week.  I would like to take this opportunity to thank you for allowing me to continue to participate in this patient's care.  CC Referral:   cc: Dr. Fulton Reek   Electronic Signatures: Baruch Gouty Roda Shutters (MD)  (Signed 08-Aug-13 15:33)  Authored: HPI, Diagnosis, Past Hx, PFSH, Allergies, Home Meds, ROS, Nursing Notes, Physical Exam, Encounter Assessment and Plan, CC Referring Physician   Last Updated: 08-Aug-13 15:33 by Armstead Peaks (MD)
